# Patient Record
Sex: Male | Born: 1965 | Race: Black or African American | Hispanic: No | Marital: Single | Smoking: Current every day smoker
Health system: Southern US, Community
[De-identification: ages and names within clinical notes are randomized; demographics above are authoritative.]

## PROBLEM LIST (undated history)

## (undated) DIAGNOSIS — Z87898 Personal history of other specified conditions: Secondary | ICD-10-CM

## (undated) DIAGNOSIS — D649 Anemia, unspecified: Secondary | ICD-10-CM

## (undated) DIAGNOSIS — Z87828 Personal history of other (healed) physical injury and trauma: Secondary | ICD-10-CM

## (undated) HISTORY — PX: CRANIOTOMY: SHX93

## (undated) HISTORY — DX: Anemia, unspecified: D64.9

## (undated) HISTORY — PX: CRANIECTOMY: SHX331

## (undated) HISTORY — DX: Personal history of other (healed) physical injury and trauma: Z87.828

## (undated) HISTORY — DX: Personal history of other specified conditions: Z87.898

## (undated) MED FILL — Ferumoxytol Inj 510 MG/17ML (30 MG/ML) (Elemental Fe): INTRAVENOUS | Qty: 17 | Status: AC

---

## 2004-07-17 ENCOUNTER — Ambulatory Visit: Payer: Self-pay | Admitting: Nurse Practitioner

## 2004-07-17 ENCOUNTER — Ambulatory Visit: Payer: Self-pay | Admitting: *Deleted

## 2004-07-18 ENCOUNTER — Ambulatory Visit (HOSPITAL_COMMUNITY): Admission: RE | Admit: 2004-07-18 | Discharge: 2004-07-18 | Payer: Self-pay | Admitting: Internal Medicine

## 2004-09-02 ENCOUNTER — Ambulatory Visit: Payer: Self-pay | Admitting: Nurse Practitioner

## 2004-09-07 ENCOUNTER — Emergency Department (HOSPITAL_COMMUNITY): Admission: EM | Admit: 2004-09-07 | Discharge: 2004-09-07 | Payer: Self-pay | Admitting: Family Medicine

## 2004-10-18 ENCOUNTER — Ambulatory Visit: Payer: Self-pay | Admitting: Nurse Practitioner

## 2004-11-22 ENCOUNTER — Ambulatory Visit: Payer: Self-pay | Admitting: Nurse Practitioner

## 2005-01-10 ENCOUNTER — Ambulatory Visit: Payer: Self-pay | Admitting: Nurse Practitioner

## 2005-01-27 ENCOUNTER — Ambulatory Visit: Payer: Self-pay | Admitting: Nurse Practitioner

## 2005-01-31 ENCOUNTER — Ambulatory Visit: Payer: Self-pay | Admitting: Nurse Practitioner

## 2005-02-26 ENCOUNTER — Ambulatory Visit: Payer: Self-pay | Admitting: Nurse Practitioner

## 2005-03-14 ENCOUNTER — Ambulatory Visit: Payer: Self-pay | Admitting: Nurse Practitioner

## 2005-03-26 ENCOUNTER — Ambulatory Visit: Payer: Self-pay | Admitting: Internal Medicine

## 2005-04-09 ENCOUNTER — Ambulatory Visit: Payer: Self-pay | Admitting: Nurse Practitioner

## 2005-04-21 ENCOUNTER — Ambulatory Visit: Payer: Self-pay | Admitting: Nurse Practitioner

## 2005-06-22 ENCOUNTER — Inpatient Hospital Stay (HOSPITAL_COMMUNITY): Admission: EM | Admit: 2005-06-22 | Discharge: 2005-06-24 | Payer: Self-pay | Admitting: Family Medicine

## 2018-11-17 DIAGNOSIS — E872 Acidosis: Secondary | ICD-10-CM

## 2018-11-17 DIAGNOSIS — F10239 Alcohol dependence with withdrawal, unspecified: Secondary | ICD-10-CM

## 2018-11-17 DIAGNOSIS — K760 Fatty (change of) liver, not elsewhere classified: Secondary | ICD-10-CM

## 2018-11-17 DIAGNOSIS — R569 Unspecified convulsions: Secondary | ICD-10-CM

## 2018-11-17 DIAGNOSIS — E876 Hypokalemia: Secondary | ICD-10-CM

## 2018-11-17 DIAGNOSIS — I160 Hypertensive urgency: Secondary | ICD-10-CM

## 2022-04-15 ENCOUNTER — Other Ambulatory Visit: Payer: Self-pay | Admitting: Oncology

## 2022-04-15 NOTE — Progress Notes (Signed)
[facility]  [address] [place],  [place]  [address] [phone]  Clinic Day:  04/16/2022  Referring physician: Leane Call, PA-C   HISTORY OF PRESENT ILLNESS:  The patient is a 56 y.o. male  who I was asked to consult upon for iron deficiency anemia.  Recent labs showed a low hemoglobin of 7.4, with an MCV of 83.5.  Iron studies done recently showed a low ferritin of 20, a low serum iron of 10, a TIBC of 473, and a low iron saturation of 2%.  The patient denies having any overt forms of blood loss to explain his anemia.  Of note, this gentleman has never undergone a previous colonoscopy.  His significant other claims he has been taking oral iron for at least the past year.  She is also concerned that he has lost nearly 50 pounds over this calendar year.  To his knowledge, there is no family history of colorectal cancer or any hematologic disorders.  PAST MEDICAL HISTORY:   Past Medical History:  Diagnosis Date   Anemia    History of seizures    History of subdural hematoma (post traumatic)     PAST SURGICAL HISTORY:  Brain trauma surgery  CURRENT MEDICATIONS:   Current Outpatient Medications  Medication Sig Dispense Refill   ferrous sulfate 325 (65 FE) MG tablet Take one tablet twice per day     levETIRAcetam (KEPPRA) 500 MG tablet Take by mouth.     magnesium oxide (MAG-OX) 400 MG tablet Take by mouth.     potassium chloride (KLOR-CON) 10 MEQ tablet Take 1 tablet by mouth daily.     No current facility-administered medications for this visit.    ALLERGIES:  No Known Allergies  FAMILY HISTORY:   Family History  Problem Relation Age of Onset   Diabetes Mother    Hypertension Mother    CVA Mother    Seizures Mother    Migraines Mother    Hypertension Father    Migraines Father    Heart disease Sister    Heart disease Sister    Kidney disease Sister   His father died from unspecified cancer.  SOCIAL HISTORY:  The  patient was born and raised in [place].  He currently lives in town with his girlfriend.  He has 2 children, 1 of whom is deceased.  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  He admits to drinking at least 3 beers daily for the past 32 years.  He has smoked at least a half a pack of cigarettes daily for the past 32 years.  REVIEW OF SYSTEMS:  Review of Systems  Constitutional:  Positive for unexpected weight change.  Gastrointestinal:  Positive for constipation.  Musculoskeletal:  Positive for gait problem.  Neurological:  Positive for gait problem.     PHYSICAL EXAM:  Blood pressure (!) 170/96, pulse 74, temperature 98.1 F (36.7 C), resp. rate 16, height 5\' 11"  (1.803 m), weight 153 lb 9.6 oz (69.7 kg), SpO2 100 %. Wt Readings from Last 3 Encounters:  04/16/22 153 lb 9.6 oz (69.7 kg)   Body mass index is 21.42 kg/m. Performance status (ECOG): 2 - Symptomatic, <50% confined to bed Physical Exam Constitutional:      Appearance: Normal appearance. He is not ill-appearing.     Comments: A chronically ill-appearing gentleman ambulating with a cane  HENT:     Mouth/Throat:     Mouth:  Mucous membranes are moist.     Pharynx: Oropharynx is clear. No oropharyngeal exudate or posterior oropharyngeal erythema.  Cardiovascular:     Rate and Rhythm: Normal rate and regular rhythm.     Heart sounds: No murmur heard.    No friction rub. No gallop.  Pulmonary:     Effort: Pulmonary effort is normal. No respiratory distress.     Breath sounds: Normal breath sounds. No wheezing, rhonchi or rales.  Abdominal:     General: Bowel sounds are normal. There is no distension.     Palpations: Abdomen is soft. There is no mass.     Tenderness: There is no abdominal tenderness.  Musculoskeletal:        General: No swelling.     Right lower leg: No edema.     Left lower leg: No edema.  Lymphadenopathy:     Cervical: No cervical adenopathy.      Upper Body:     Right upper body: No supraclavicular or axillary adenopathy.     Left upper body: No supraclavicular or axillary adenopathy.     Lower Body: No right inguinal adenopathy. No left inguinal adenopathy.  Skin:    General: Skin is warm.     Coloration: Skin is not jaundiced.     Findings: No lesion or rash.  Neurological:     General: No focal deficit present.     Mental Status: He is alert and oriented to person, place, and time. Mental status is at baseline.  Psychiatric:        Mood and Affect: Mood normal.        Behavior: Behavior normal.        Thought Content: Thought content normal.   Positive LABS:  .  ASSESSMENT & PLAN:  A 56 y.o. male who I was asked to consult upon for iron deficiency anemia.  I will arrange for him to receive IV iron over these next few weeks to rapidly replenish his iron stores and normalize his hemoglobin.  When factoring this in with his iron deficiency anemia and significant weight loss, my concern is he may have an occult lower GI tract neoplasm.  As mentioned previously, this gentleman has never undergone a colonoscopy.  I will have him see GI in the forthcoming weeks to have this procedure done.  Otherwise, I will see him back in 3 months to reassess his iron and hemoglobin levels to see how well he responded to his upcoming IV iron.  The patient understands all the plans discussed today and is in agreement with them.  I do appreciate Nodal, Joline Salt, PA-C for his new consult.   Gabrille Kilbride Kirby Funk, MD

## 2022-04-16 ENCOUNTER — Other Ambulatory Visit: Payer: Self-pay | Admitting: Oncology

## 2022-04-16 ENCOUNTER — Encounter: Payer: Self-pay | Admitting: Oncology

## 2022-04-16 ENCOUNTER — Other Ambulatory Visit: Payer: Self-pay

## 2022-04-16 ENCOUNTER — Inpatient Hospital Stay: Payer: Medicaid Other | Attending: Oncology | Admitting: Oncology

## 2022-04-16 ENCOUNTER — Inpatient Hospital Stay: Payer: Medicaid Other

## 2022-04-16 VITALS — BP 170/96 | HR 74 | Temp 98.1°F | Resp 16 | Ht 71.0 in | Wt 153.6 lb

## 2022-04-16 DIAGNOSIS — D508 Other iron deficiency anemias: Secondary | ICD-10-CM

## 2022-04-16 DIAGNOSIS — D509 Iron deficiency anemia, unspecified: Secondary | ICD-10-CM | POA: Diagnosis not present

## 2022-04-16 LAB — IRON AND TIBC
Iron: 293 ug/dL — ABNORMAL HIGH (ref 45–182)
Saturation Ratios: 66 % — ABNORMAL HIGH (ref 17.9–39.5)
TIBC: 445 ug/dL (ref 250–450)
UIBC: 152 ug/dL

## 2022-04-16 LAB — FERRITIN: Ferritin: 63 ng/mL (ref 24–336)

## 2022-04-18 MED FILL — Ferumoxytol Inj 510 MG/17ML (30 MG/ML) (Elemental Fe): INTRAVENOUS | Qty: 17 | Status: AC

## 2022-04-21 ENCOUNTER — Inpatient Hospital Stay: Payer: Medicaid Other

## 2022-04-21 VITALS — BP 129/93 | HR 70 | Temp 98.1°F | Resp 16 | Ht 71.0 in | Wt 154.0 lb

## 2022-04-21 DIAGNOSIS — D509 Iron deficiency anemia, unspecified: Secondary | ICD-10-CM | POA: Diagnosis not present

## 2022-04-21 DIAGNOSIS — D508 Other iron deficiency anemias: Secondary | ICD-10-CM

## 2022-04-21 MED ORDER — SODIUM CHLORIDE 0.9 % IV SOLN
510.0000 mg | Freq: Once | INTRAVENOUS | Status: AC
  Administered 2022-04-21: 510 mg via INTRAVENOUS
  Filled 2022-04-21: qty 510

## 2022-04-21 MED ORDER — SODIUM CHLORIDE 0.9 % IV SOLN: Freq: Once | INTRAVENOUS | Status: AC

## 2022-04-21 NOTE — Patient Instructions (Signed)

## 2022-04-22 ENCOUNTER — Inpatient Hospital Stay: Payer: Medicaid Other | Admitting: Dietician

## 2022-04-22 NOTE — Progress Notes (Signed)
Patient didn't report for scheduled in person nutrition consult.  Called home telephone and was unable to leave a message as his voice mail was not set up. Sent text to mobile with my contact information to reschedule for a remote consult. Gennaro Africa, RDN, LDN Registered Dietitian, [facility] Part Time Remote (Usual office hours: Tuesday-Thursday) Mobile: [phone] Remote Office: [phone]

## 2022-04-28 ENCOUNTER — Inpatient Hospital Stay: Payer: Medicaid Other

## 2022-04-28 VITALS — BP 133/94 | HR 70 | Temp 97.9°F | Resp 20 | Ht 71.0 in | Wt 154.2 lb

## 2022-04-28 DIAGNOSIS — D509 Iron deficiency anemia, unspecified: Secondary | ICD-10-CM | POA: Diagnosis not present

## 2022-04-28 DIAGNOSIS — D508 Other iron deficiency anemias: Secondary | ICD-10-CM

## 2022-04-28 MED ORDER — SODIUM CHLORIDE 0.9 % IV SOLN: Freq: Once | INTRAVENOUS | Status: AC

## 2022-04-28 MED ORDER — SODIUM CHLORIDE 0.9 % IV SOLN
510.0000 mg | Freq: Once | INTRAVENOUS | Status: AC
  Administered 2022-04-28: 510 mg via INTRAVENOUS
  Filled 2022-04-28: qty 510

## 2022-04-28 NOTE — Patient Instructions (Signed)
Iron Deficiency Anemia, Adult  Iron deficiency anemia is a condition in which the concentration of red blood cells or hemoglobin in the blood is below normal because of too little iron. Hemoglobin is a substance in red blood cells that carries oxygen to the body's tissues. When the concentration of red blood cells or hemoglobin is too low, not enough oxygen reaches these tissues. Iron deficiency anemia is usually long-lasting, and it develops over time. It may or may not cause symptoms. It is a common type of anemia. What are the causes? This condition may be caused by: Not enough iron in the diet. Abnormal absorption in the gut. Blood loss. What increases the risk? You are more likely to develop this condition if you get menstrual periods (menstruate) or are pregnant. What are the signs or symptoms? Symptoms of this condition may include: Pale skin, lips, and nail beds. Weakness, dizziness, and getting tired easily. Shortness of breath when moving or exercising. Cold hands or feet. Mild anemia may not cause any symptoms. How is this diagnosed? This condition is diagnosed based on: Your medical history. A physical exam. Blood tests. How is this treated? This condition is treated by correcting the cause of your iron deficiency. Treatment may involve: Adding iron-rich foods to your diet. Taking iron supplements. If you are pregnant or breastfeeding, you may need to take extra iron because your normal diet usually does not provide the amount of iron that you need. Increasing vitamin C intake. Vitamin C helps your body absorb iron. Your health care provider may recommend that you take iron supplements along with a glass of orange juice or a vitamin C supplement. Medicines to make heavy menstrual flow lighter. Surgery or additional testing procedures to determine the cause of your anemia. You may need repeat blood tests to determine whether treatment is working. If the treatment does not  seem to be working, you may need more tests. Follow these instructions at home: Medicines Take over-the-counter and prescription medicines only as told by your health care provider. This includes iron supplements and vitamins. This is important because too much iron can be harmful. For the best iron absorption, you should take iron supplements when your stomach is empty. If you cannot tolerate them on an empty stomach, you may need to take them with food. Do not drink milk or take antacids at the same time as your iron supplements. Milk and antacids may interfere with how your body absorbs iron. Iron supplements may turn stool (feces) a darker color and it may appear black. If you cannot tolerate taking iron supplements by mouth, talk with your health care provider about taking them through an IV or through an injection into a muscle. Eating and drinking Talk with your health care provider before changing your diet. Your provider may recommend that you eat foods that contain a lot of iron, such as: Liver. Low-fat (lean) beef. Breads and cereals that have iron added to them (are fortified). Eggs. Dried fruit. Dark green, leafy vegetables. To help your body use the iron from iron-rich foods, eat those foods at the same time as fresh fruits and vegetables that are high in vitamin C. Foods that are high in vitamin C include: Oranges. Peppers. Tomatoes. Mangoes. Managing constipation If you are taking an iron supplement, it may cause constipation. To prevent or treat constipation, you may need to: Drink enough fluid to keep your urine pale yellow. Take over-the-counter or prescription medicines. Eat foods that are high in fiber, such   as beans, whole grains, and fresh fruits and vegetables. Limit foods that are high in fat and processed sugars, such as fried or sweet foods. General instructions Return to your normal activities as told by your health care provider. Ask your health care provider  what activities are safe for you. Keep all follow-up visits. Contact a health care provider if: You feel nauseous or you vomit. You feel weak. You become light-headed when getting up from a sitting or lying down position. You have unexplained sweating. You develop symptoms of constipation. You have a heaviness in your chest. You have trouble breathing with physical activity. Get help right away if: You faint. If this happens, do not drive yourself to the hospital. You have an irregular or rapid heartbeat. Summary Iron deficiency anemia is a condition in which the concentration of red blood cells or hemoglobin in the blood is below normal because of too little iron. This condition is treated by correcting the cause of your iron deficiency. Take over-the-counter and prescription medicines only as told by your health care provider. This includes iron supplements and vitamins. To help your body use the iron from iron-rich foods, eat those foods at the same time as fresh fruits and vegetables that are high in vitamin C. Seek medical help if you have signs or symptoms of worsening anemia. This information is not intended to replace advice given to you by your health care provider. Make sure you discuss any questions you have with your health care provider. Document Revised: 06/26/2021 Document Reviewed: 06/26/2021 Elsevier Patient Education  2023 [employer] Iron-Rich Diet  Iron is a mineral that helps your body produce hemoglobin. Hemoglobin is a protein in red blood cells that carries oxygen to your body's tissues. Eating too little iron may cause you to feel weak and tired, and it can increase your risk of infection. Iron is naturally found in many foods, and many foods have iron added to them (are iron-fortified). You may need to follow an iron-rich diet if you do not have enough iron in your body due to certain medical conditions. The amount of iron that you need each day depends on your  age, your sex, and any medical conditions you have. Follow instructions from your health care provider or a dietitian about how much iron you should eat each day. What are tips for following this plan? Reading food labels Check food labels to see how many milligrams (mg) of iron are in each serving. Cooking Cook foods in pots and pans that are made from iron. Take these steps to make it easier for your body to absorb iron from certain foods: Soak beans overnight before cooking. Soak whole grains overnight and drain them before using. Ferment flours before baking, such as by using yeast in bread dough. Meal planning When you eat foods that contain iron, you should eat them with foods that are high in vitamin C. These include oranges, peppers, tomatoes, potatoes, and mangoes. Vitamin C helps your body absorb iron. Certain foods and drinks prevent your body from absorbing iron properly. Avoid eating these foods in the same meal as iron-rich foods or with iron supplements. These foods include: Coffee, black tea, and red wine. Milk, dairy products, and foods that are high in calcium. Beans and soybeans. Whole grains. General information Take iron supplements only as told by your health care provider. An overdose of iron can be life-threatening. If you were prescribed iron supplements, take them with orange juice or a vitamin C   supplement. When you eat iron-fortified foods or take an iron supplement, you should also eat foods that naturally contain iron, such as meat, poultry, and fish. Eating naturally iron-rich foods helps your body absorb the iron that is added to other foods or contained in a supplement. Iron from animal sources is better absorbed than iron from plant sources. What foods should I eat? Fruits Prunes. Raisins. Eat fruits high in vitamin C, such as oranges, grapefruits, and strawberries, with iron-rich foods. Vegetables Spinach (cooked). Green peas. Broccoli. Fermented  vegetables. Eat vegetables high in vitamin C, such as leafy greens, potatoes, bell peppers, and tomatoes, with iron-rich foods. Grains Iron-fortified breakfast cereal. Iron-fortified whole-wheat bread. Enriched rice. Sprouted grains. Meats and other proteins Beef liver. Beef. Turkey. Chicken. Oysters. Shrimp. Tuna. Sardines. Chickpeas. Nuts. Tofu. Pumpkin seeds. Beverages Tomato juice. Fresh orange juice. Prune juice. Hibiscus tea. Iron-fortified instant breakfast shakes. Sweets and desserts Blackstrap molasses. Seasonings and condiments Tahini. Fermented soy sauce. Other foods Wheat germ. The items listed above may not be a complete list of recommended foods and beverages. Contact a dietitian for more information. What foods should I limit? These are foods that should be limited while eating iron-rich foods as they can reduce the absorption of iron in your body. Grains Whole grains. Bran cereal. Bran flour. Meats and other proteins Soybeans. Products made from soy protein. Black beans. Lentils. Mung beans. Split peas. Dairy Milk. Cream. Cheese. Yogurt. Cottage cheese. Beverages Coffee. Black tea. Red wine. Sweets and desserts Cocoa. Chocolate. Ice cream. Seasonings and condiments Basil. Oregano. Large amounts of parsley. The items listed above may not be a complete list of foods and beverages you should limit. Contact a dietitian for more information. Summary Iron is a mineral that helps your body produce hemoglobin. Hemoglobin is a protein in red blood cells that carries oxygen to your body's tissues. Iron is naturally found in many foods, and many foods have iron added to them (are iron-fortified). When you eat foods that contain iron, you should eat them with foods that are high in vitamin C. Vitamin C helps your body absorb iron. Certain foods and drinks prevent your body from absorbing iron properly, such as whole grains and dairy products. You should avoid eating these foods  in the same meal as iron-rich foods or with iron supplements. This information is not intended to replace advice given to you by your health care provider. Make sure you discuss any questions you have with your health care provider. Document Revised: 04/30/2020 Document Reviewed: 04/30/2020 Elsevier Patient Education  2023 Else[employer] Inc.  

## 2022-06-25 LAB — VITAMIN B12: Vitamin B-12: 931

## 2022-06-25 LAB — COMPREHENSIVE METABOLIC PANEL
Albumin: 3.3 — AB (ref 3.5–5.0)
Calcium: 9 (ref 8.7–10.7)

## 2022-06-25 LAB — CBC AND DIFFERENTIAL
HCT: 34 — AB (ref 41–53)
Hemoglobin: 11.2 — AB (ref 13.5–17.5)
Neutrophils Absolute: 2.77
Platelets: 182 10*3/uL (ref 150–400)
WBC: 4.2

## 2022-06-25 LAB — BASIC METABOLIC PANEL
BUN: 7 (ref 4–21)
CO2: 24 — AB (ref 13–22)
Chloride: 109 — AB (ref 99–108)
Creatinine: 0.6 (ref 0.6–1.3)
Glucose: 97
Potassium: 3.7 mEq/L (ref 3.5–5.1)
Sodium: 141 (ref 137–147)

## 2022-06-25 LAB — IRON,TIBC AND FERRITIN PANEL
%SAT: 18.6
Ferritin: 108
Iron: 46
TIBC: 247

## 2022-06-25 LAB — HEPATIC FUNCTION PANEL
ALT: 37 U/L (ref 10–40)
AST: 61 — AB (ref 14–40)
Alkaline Phosphatase: 214 — AB (ref 25–125)
Bilirubin, Total: 0.4

## 2022-06-25 LAB — CBC: RBC: 3.37 — AB (ref 3.87–5.11)

## 2022-07-09 LAB — FOLATE: Folate: 6.88

## 2022-07-15 ENCOUNTER — Telehealth: Payer: Self-pay | Admitting: Oncology

## 2022-07-15 NOTE — Telephone Encounter (Signed)
07/15/22 Patients wife called and cancelled appt.Patient did not get colonoscopy and refused to reschedule appts

## 2022-07-17 ENCOUNTER — Other Ambulatory Visit: Payer: Medicaid Other

## 2022-07-17 ENCOUNTER — Ambulatory Visit: Payer: Medicaid Other | Admitting: Oncology
